# Patient Record
Sex: Male | Born: 1980 | Hispanic: Yes | Marital: Married
Health system: Southern US, Community
[De-identification: ages and names within clinical notes are randomized; demographics above are authoritative.]

---

## 2020-01-31 ENCOUNTER — Other Ambulatory Visit: Payer: Self-pay

## 2020-01-31 DIAGNOSIS — Z20822 Contact with and (suspected) exposure to covid-19: Secondary | ICD-10-CM

## 2020-02-01 LAB — SARS-COV-2, NAA 2 DAY TAT

## 2020-02-01 LAB — NOVEL CORONAVIRUS, NAA: SARS-CoV-2, NAA: NOT DETECTED

## 2020-06-03 ENCOUNTER — Emergency Department (HOSPITAL_COMMUNITY): Payer: Self-pay

## 2020-06-03 ENCOUNTER — Other Ambulatory Visit: Payer: Self-pay

## 2020-06-03 ENCOUNTER — Emergency Department (HOSPITAL_COMMUNITY)
Admission: EM | Admit: 2020-06-03 | Discharge: 2020-06-03 | Disposition: A | Discharge status: Home / Self Care | Payer: Self-pay | Attending: Emergency Medicine | Admitting: Emergency Medicine

## 2020-06-03 ENCOUNTER — Encounter (HOSPITAL_COMMUNITY): Payer: Self-pay

## 2020-06-03 DIAGNOSIS — N23 Unspecified renal colic: Secondary | ICD-10-CM | POA: Insufficient documentation

## 2020-06-03 LAB — CBC WITH DIFFERENTIAL/PLATELET
Abs Immature Granulocytes: 0.02 10*3/uL (ref 0.00–0.07)
Basophils Absolute: 0 10*3/uL (ref 0.0–0.1)
Basophils Relative: 0 %
Eosinophils Absolute: 0.2 10*3/uL (ref 0.0–0.5)
Eosinophils Relative: 2 %
HCT: 39.8 % (ref 39.0–52.0)
Hemoglobin: 14.2 g/dL (ref 13.0–17.0)
Immature Granulocytes: 0 %
Lymphocytes Relative: 23 %
Lymphs Abs: 2.2 10*3/uL (ref 0.7–4.0)
MCH: 31.6 pg (ref 26.0–34.0)
MCHC: 35.7 g/dL (ref 30.0–36.0)
MCV: 88.4 fL (ref 80.0–100.0)
Monocytes Absolute: 0.7 10*3/uL (ref 0.1–1.0)
Monocytes Relative: 7 %
Neutro Abs: 6.7 10*3/uL (ref 1.7–7.7)
Neutrophils Relative %: 68 %
Platelets: 225 10*3/uL (ref 150–400)
RBC: 4.5 MIL/uL (ref 4.22–5.81)
RDW: 12 % (ref 11.5–15.5)
WBC: 9.8 10*3/uL (ref 4.0–10.5)
nRBC: 0 % (ref 0.0–0.2)

## 2020-06-03 LAB — URINALYSIS, ROUTINE W REFLEX MICROSCOPIC
Bacteria, UA: NONE SEEN
Bilirubin Urine: NEGATIVE
Glucose, UA: NEGATIVE mg/dL
Ketones, ur: NEGATIVE mg/dL
Leukocytes,Ua: NEGATIVE
Nitrite: NEGATIVE
Protein, ur: NEGATIVE mg/dL
Specific Gravity, Urine: 1.011 (ref 1.005–1.030)
pH: 5 (ref 5.0–8.0)

## 2020-06-03 LAB — LIPASE, BLOOD: Lipase: 34 U/L (ref 11–51)

## 2020-06-03 LAB — COMPREHENSIVE METABOLIC PANEL
ALT: 133 U/L — ABNORMAL HIGH (ref 0–44)
AST: 68 U/L — ABNORMAL HIGH (ref 15–41)
Albumin: 4.2 g/dL (ref 3.5–5.0)
Alkaline Phosphatase: 63 U/L (ref 38–126)
Anion gap: 9 (ref 5–15)
BUN: 21 mg/dL — ABNORMAL HIGH (ref 6–20)
CO2: 22 mmol/L (ref 22–32)
Calcium: 8.8 mg/dL — ABNORMAL LOW (ref 8.9–10.3)
Chloride: 101 mmol/L (ref 98–111)
Creatinine, Ser: 1.01 mg/dL (ref 0.61–1.24)
GFR, Estimated: >60 mL/min (ref >60)
Glucose, Bld: 100 mg/dL — ABNORMAL HIGH (ref 70–99)
Potassium: 3.8 mmol/L (ref 3.5–5.1)
Sodium: 132 mmol/L — ABNORMAL LOW (ref 135–145)
Total Bilirubin: 0.8 mg/dL (ref 0.3–1.2)
Total Protein: 7.3 g/dL (ref 6.5–8.1)

## 2020-06-03 MED ORDER — ONDANSETRON HCL 4 MG/2ML IJ SOLN
4.0000 mg | Freq: Once | INTRAMUSCULAR | Status: AC
  Administered 2020-06-03: 4 mg via INTRAVENOUS
  Filled 2020-06-03: qty 2

## 2020-06-03 MED ORDER — KETOROLAC TROMETHAMINE 30 MG/ML IJ SOLN
30.0000 mg | Freq: Once | INTRAMUSCULAR | Status: AC
  Administered 2020-06-03: 30 mg via INTRAVENOUS
  Filled 2020-06-03: qty 1

## 2020-06-03 MED ORDER — OXYCODONE-ACETAMINOPHEN 5-325 MG PO TABS: 1.0000 | ORAL_TABLET | ORAL | 0 refills | Status: AC | PRN

## 2020-06-03 MED ORDER — TAMSULOSIN HCL 0.4 MG PO CAPS: 0.4000 mg | ORAL_CAPSULE | Freq: Every day | ORAL | 0 refills | Status: AC

## 2020-06-03 MED ORDER — HYDROMORPHONE HCL 1 MG/ML IJ SOLN
1.0000 mg | Freq: Once | INTRAMUSCULAR | Status: AC
  Administered 2020-06-03: 1 mg via INTRAVENOUS
  Filled 2020-06-03: qty 1

## 2020-06-03 NOTE — ED Triage Notes (Signed)
Pt states that he is having LLQ pain that is radiating to back and groin area that started 3 hours ago.

## 2020-06-03 NOTE — ED Notes (Signed)
Patient transported to CT 

## 2020-06-03 NOTE — ED Provider Notes (Signed)
[facility] EMERGENCY DEPARTMENT Provider Note   CSN: [id number] Arrival date & time: 06/03/20  0340     History Chief Complaint  Patient presents with  . Abdominal Pain    Jason Nichols is a 40 y.o. male.  Patient presents with moderate to severe pain in the left lower abdomen that radiates to the left testicle and into his back.  Patient reports the pain woke him up from sleep approximately 3 hours ago.  He took Tylenol but it did not help much.  He has not noticed any urinary symptoms.  No nausea or vomiting.  Has never had similar symptoms.  He has noticed that if he puts gentle pressure in the left lower abdomen it feels better.        History reviewed. No pertinent past medical history.  There are no problems to display for this patient.   History reviewed. No pertinent surgical history.     No family history on file.     Home Medications Prior to Admission medications   Not on File    Allergies    Patient has no allergy information on record.  Review of Systems   Review of Systems  Gastrointestinal: Positive for abdominal pain.  Genitourinary: Positive for flank pain.  All other systems reviewed and are negative.   Physical Exam Updated Vital Signs BP (!) 149/99   Pulse 80   Temp 98.6 F (37 C) (Oral)   Resp 18   Ht 5\' 6"  (1.676 m)   Wt 86.2 kg   SpO2 98%   BMI 30.67 kg/m   Physical Exam Vitals and nursing note reviewed.  Constitutional:      General: He is not in acute distress.    Appearance: Normal appearance. He is well-developed and well-nourished.  HENT:     Head: Normocephalic and atraumatic.     Right Ear: Hearing normal.     Left Ear: Hearing normal.     Nose: Nose normal.     Mouth/Throat:     Mouth: Oropharynx is clear and moist and mucous membranes are normal.  Eyes:     Extraocular Movements: EOM normal.     Conjunctiva/sclera: Conjunctivae normal.     Pupils: Pupils are equal, round, and reactive to light.   Cardiovascular:     Rate and Rhythm: Regular rhythm.     Heart sounds: S1 normal and S2 normal. No murmur heard. No friction rub. No gallop.   Pulmonary:     Effort: Pulmonary effort is normal. No respiratory distress.     Breath sounds: Normal breath sounds.  Chest:     Chest wall: No tenderness.  Abdominal:     General: Bowel sounds are normal.     Palpations: Abdomen is soft. There is no hepatosplenomegaly.     Tenderness: There is no abdominal tenderness. There is no guarding or rebound. Negative signs include Murphy's sign and McBurney's sign.     Hernia: No hernia is present.  Musculoskeletal:        General: Normal range of motion.     Cervical back: Normal range of motion and neck supple.  Skin:    General: Skin is warm, dry and intact.     Findings: No rash.     Nails: There is no cyanosis.  Neurological:     Mental Status: He is alert and oriented to person, place, and time.     GCS: GCS eye subscore is 4. GCS verbal subscore is 5. GCS motor  subscore is 6.     Cranial Nerves: No cranial nerve deficit.     Sensory: No sensory deficit.     Coordination: Coordination normal.     Deep Tendon Reflexes: Strength normal.  Psychiatric:        Mood and Affect: Mood and affect normal.        Speech: Speech normal.        Behavior: Behavior normal.        Thought Content: Thought content normal.     ED Results / Procedures / Treatments   Labs (all labs ordered are listed, but only abnormal results are displayed) Labs Reviewed  CBC WITH DIFFERENTIAL/PLATELET  COMPREHENSIVE METABOLIC PANEL  LIPASE, BLOOD  URINALYSIS, ROUTINE W REFLEX MICROSCOPIC    EKG None  Radiology CT RENAL STONE STUDY  Result Date: 06/03/2020 CLINICAL DATA:  states that he is having LLQ pain that is radiating to back and groin area that started 3 hours ago EXAM: CT ABDOMEN AND PELVIS WITHOUT CONTRAST TECHNIQUE: Multidetector CT imaging of the abdomen and pelvis was performed following the  standard protocol without IV contrast. COMPARISON:  None. FINDINGS: Lower chest: No acute abnormality. Hepatobiliary: No focal liver abnormality. No gallstones, gallbladder wall thickening, or pericholecystic fluid. No biliary dilatation. Pancreas: No focal lesion. Normal pancreatic contour. No surrounding inflammatory changes. No main pancreatic ductal dilatation. Spleen: Normal in size without focal abnormality. Adrenals/Urinary Tract: No adrenal nodule bilaterally. A 5 mm left ureterovesicular junction stone is noted with associated proximal mild hydroureteronephrosis. The left kidney appears enlarged compared to the right with associated perinephric stranding. No right nephroureterolithiasis or hydroureteronephrosis. No nephrolithiasis bilaterally. No contour-deforming renal mass. The urinary bladder is unremarkable. Stomach/Bowel: Stomach is within normal limits. No evidence of bowel wall thickening or dilatation. Scattered sigmoid colon diverticulosis. Sigmoid colon is under distended. Appendix appears normal. Vascular/Lymphatic: No abdominal aorta or iliac aneurysm. Mild atherosclerotic plaque of the aorta and its branches. No abdominal, pelvic, or inguinal lymphadenopathy. Reproductive: Prostate is unremarkable. Other: No intraperitoneal free fluid. No intraperitoneal free gas. No organized fluid collection. Musculoskeletal: Possible small fat containing right inguinal hernia with an abdominal defect 5 mm. No suspicious lytic or blastic osseous lesions. No acute displaced fracture. IMPRESSION: 1. Obstructive 5 mm calcified stone at the left ureterovesicular junction. Correlate with urinalysis for superimposed infection. 2. Scattered sigmoid colon diverticulosis no findings of acute diverticulitis. 3. Possible small fat containing right inguinal hernia with an abdominal defect 5 mm. Electronically Signed   By: Tish Frederickson M.D.   On: 06/03/2020 04:58    Procedures Procedures   Medications Ordered in  ED Medications  ketorolac (TORADOL) 30 MG/ML injection 30 mg (has no administration in time range)  HYDROmorphone (DILAUDID) injection 1 mg (1 mg Intravenous Given 06/03/20 0422)  ondansetron (ZOFRAN) injection 4 mg (4 mg Intravenous Given 06/03/20 0421)    ED Course  I have reviewed the triage vital signs and the nursing notes.  Pertinent labs & imaging results that were available during my care of the patient were reviewed by me and considered in my medical decision making (see chart for details).    MDM Rules/Calculators/A&P                          Patient presents to the emergency department with complaints of somewhat sudden onset left-sided pain.  Pain is primarily in the left lower abdomen but radiates to the back and left testicle.  Kidney stone and pancreatitis  were considered possible, as was diverticulitis.  Work-up does reveal a distal UVJ stone which explains the patient's symptoms.  He was provided analgesia.  Will discharge with Flomax, analgesia, follow-up with urology.  Final Clinical Impression(s) / ED Diagnoses Final diagnoses:  Renal colic on left side    Rx / DC Orders ED Discharge Orders    None       Pollina, Canary Brim, MD 06/03/20 0505

## 2022-02-19 IMAGING — CT CT RENAL STONE PROTOCOL
2 of 4 series · 16 of 46 positions shown, 18 images · non-contrast
Comparison: None.

CLINICAL DATA: states that he is having LLQ pain that is radiating
to back and groin area that started 3 hours ago

EXAM:
CT ABDOMEN AND PELVIS WITHOUT CONTRAST
TECHNIQUE: Multidetector CT imaging of the abdomen and pelvis was performed
following the standard protocol without IV contrast.

[Series 2: axial st · axial · 0.69mm/px · z∈[-341,+74]mm · 13 of 95 slices shown, 15 images]
[im 6/95  soft-tissue]
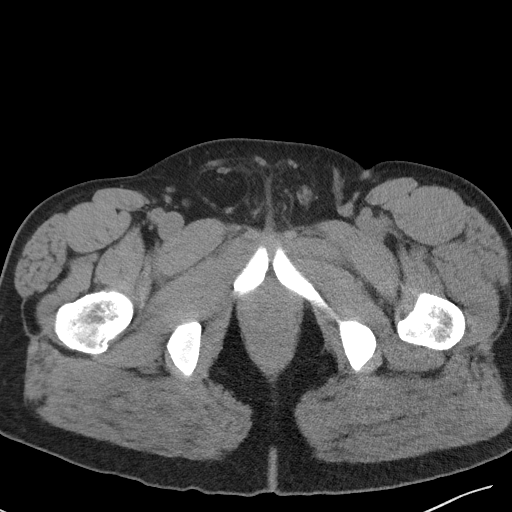
[im 6/95  bone]
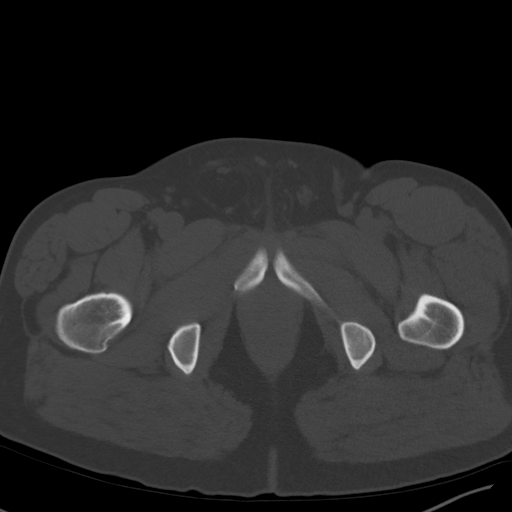
[im 11/95  soft-tissue]
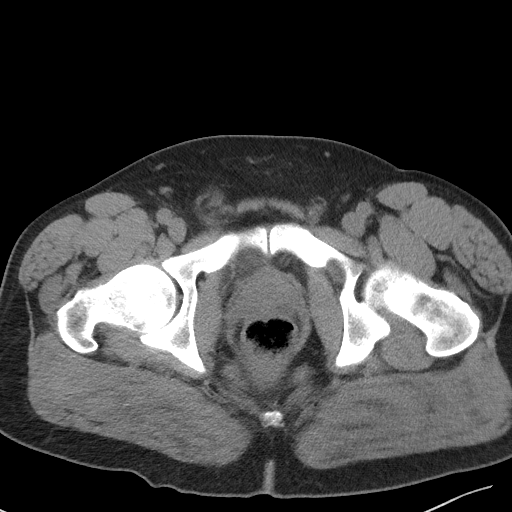
[im 21/95  soft-tissue]
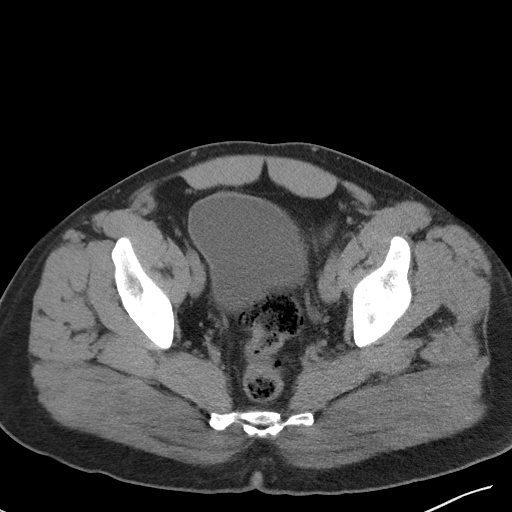
[im 27/95  soft-tissue]
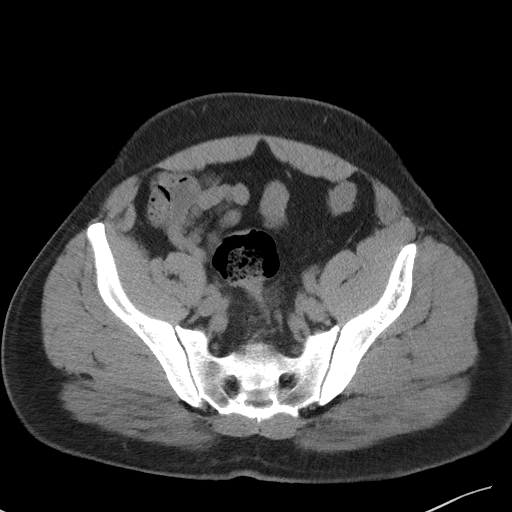
[im 32/95  soft-tissue]
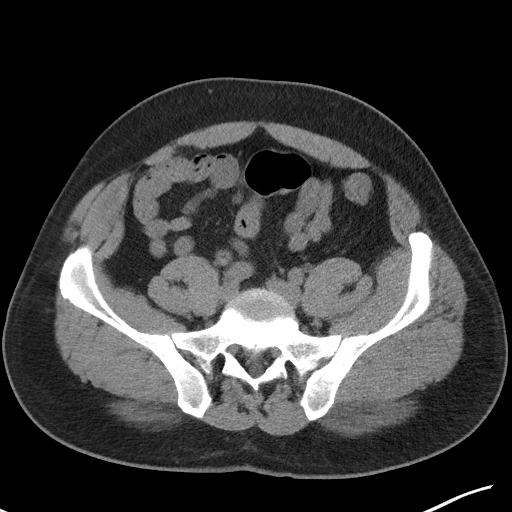
[im 42/95  soft-tissue]
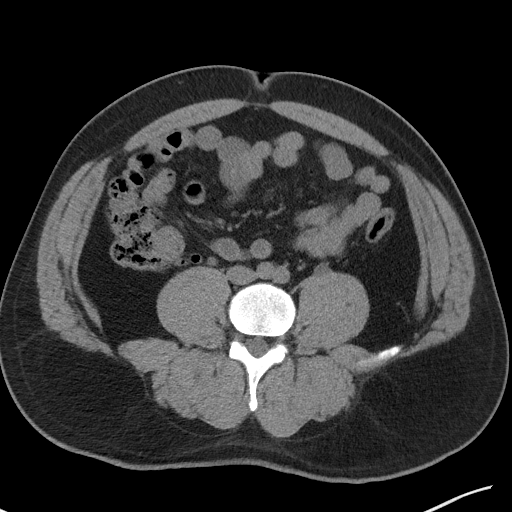
[im 48/95  soft-tissue]
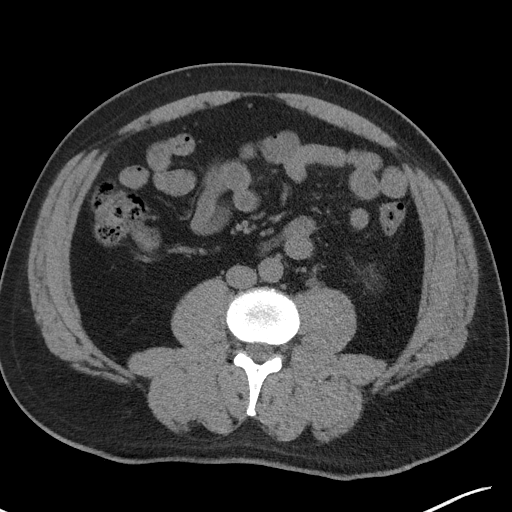
[im 53/95  soft-tissue]
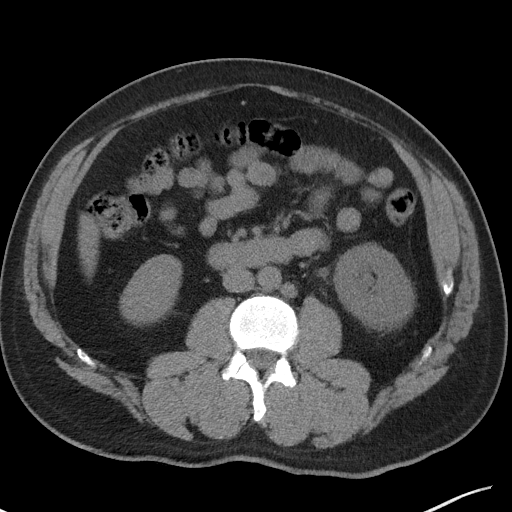
[im 63/95  soft-tissue]
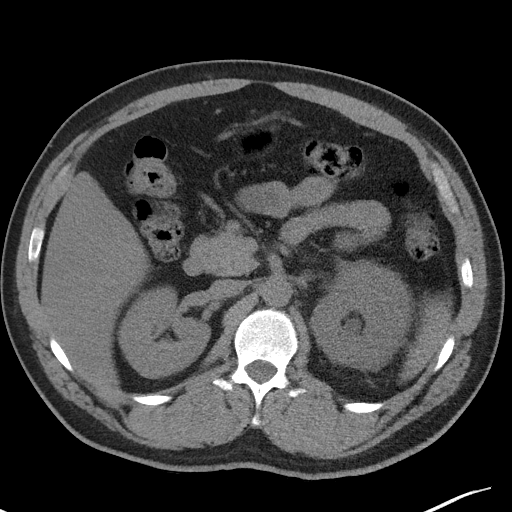
[im 63/95  bone]
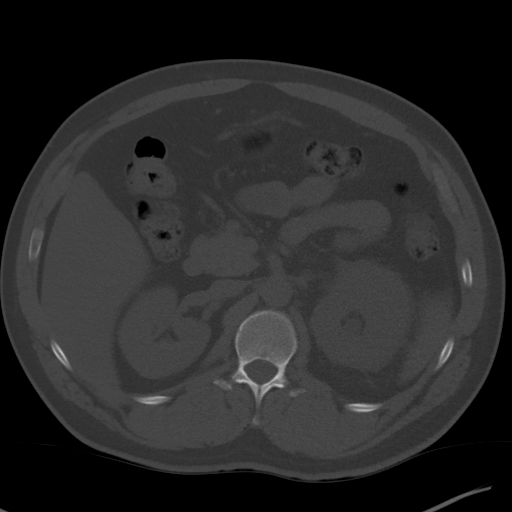
[im 68/95  soft-tissue]
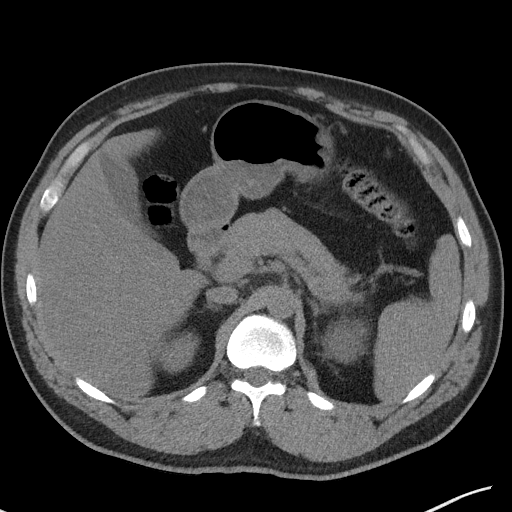
[im 74/95  soft-tissue]
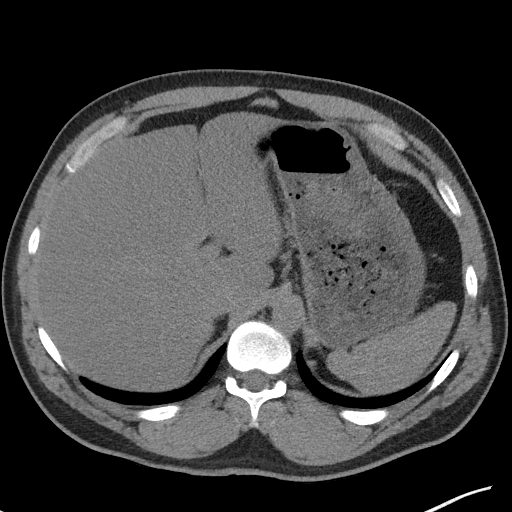
[im 84/95  soft-tissue]
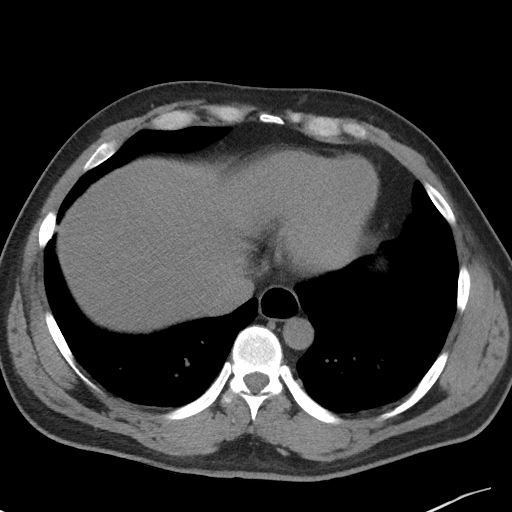
[im 89/95  soft-tissue]
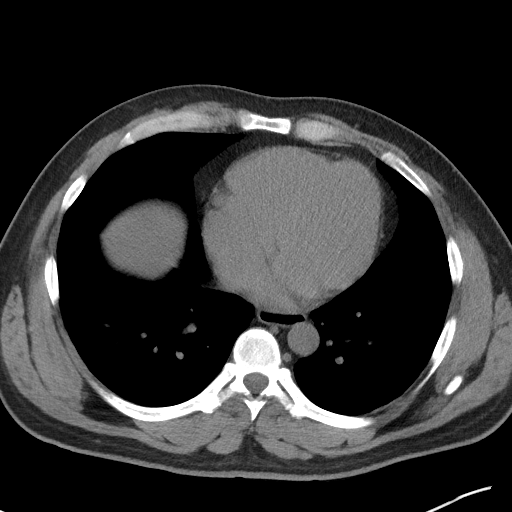

[Series 5: coronal st · coronal · 0.82mm/px · 3 of 101 slices shown]
[im 34/101  soft-tissue]
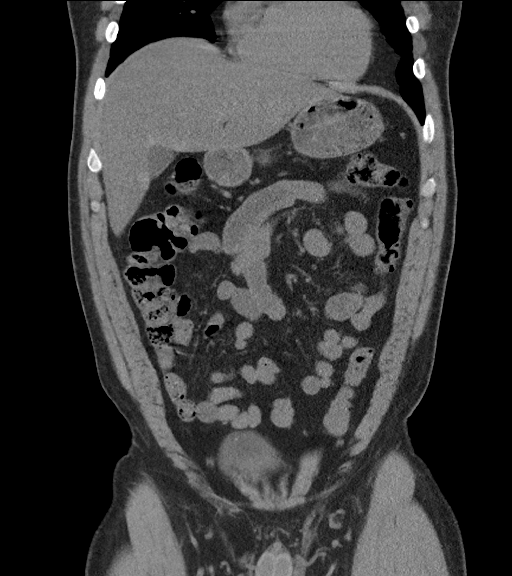
[im 45/101  soft-tissue]
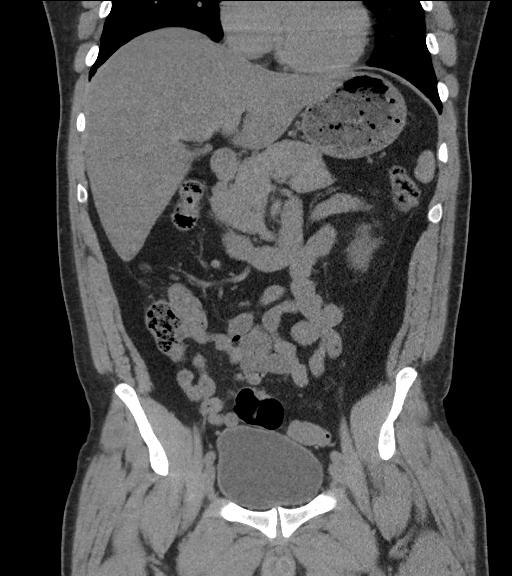
[im 56/101  soft-tissue]
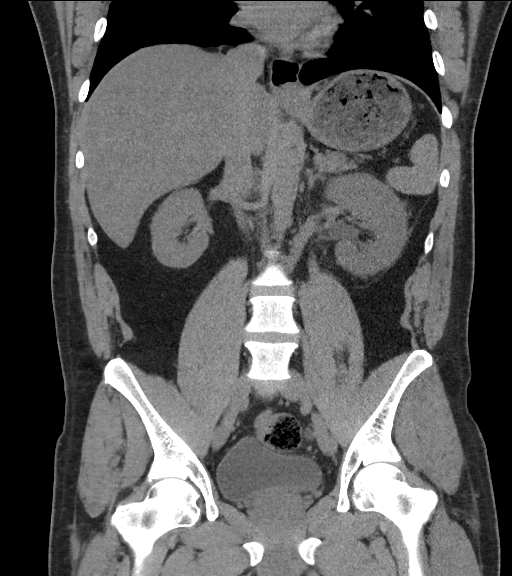

[16 of 46 positions shown; findings below may reference images not displayed]

FINDINGS: Lower chest: No acute abnormality.

Hepatobiliary: No focal liver abnormality. No gallstones,
gallbladder wall thickening, or pericholecystic fluid. No biliary
dilatation.

Pancreas: No focal lesion. Normal pancreatic contour. No surrounding
inflammatory changes. No main pancreatic ductal dilatation.

Spleen: Normal in size without focal abnormality.

Adrenals/Urinary Tract: No adrenal nodule bilaterally. A 5 mm left
ureterovesicular junction stone is noted with associated proximal
mild hydroureteronephrosis. The left kidney appears enlarged
compared to the right with associated perinephric stranding. No
right nephroureterolithiasis or hydroureteronephrosis. No
nephrolithiasis bilaterally. No contour-deforming renal mass. The
urinary bladder is unremarkable.

Stomach/Bowel: Stomach is within normal limits. No evidence of bowel
wall thickening or dilatation. Scattered sigmoid colon
diverticulosis. Sigmoid colon is under distended. Appendix appears
normal.

Vascular/Lymphatic: No abdominal aorta or iliac aneurysm. Mild
atherosclerotic plaque of the aorta and its branches. No abdominal,
pelvic, or inguinal lymphadenopathy.

Reproductive: Prostate is unremarkable.

Other: No intraperitoneal free fluid. No intraperitoneal free gas.
No organized fluid collection.

Musculoskeletal:

Possible small fat containing right inguinal hernia with an
abdominal defect 5 mm.

No suspicious lytic or blastic osseous lesions. No acute displaced
fracture.
IMPRESSION: 1. Obstructive 5 mm calcified stone at the left ureterovesicular
junction. Correlate with urinalysis for superimposed infection.
2. Scattered sigmoid colon diverticulosis no findings of acute
diverticulitis.
3. Possible small fat containing right inguinal hernia with an
abdominal defect 5 mm.
